# Patient Record
Sex: Male | Born: 1985 | Race: White | Hispanic: No | Marital: Single | Smoking: Never smoker
Health system: Southern US, Community
[De-identification: ages and names within clinical notes are randomized; demographics above are authoritative.]

---
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
## 2006-05-17 ENCOUNTER — Ambulatory Visit: Payer: Self-pay | Admitting: Sports Medicine

## 2006-05-17 DIAGNOSIS — M21619 Bunion of unspecified foot: Secondary | ICD-10-CM

## 2006-05-17 DIAGNOSIS — M2140 Flat foot [pes planus] (acquired), unspecified foot: Secondary | ICD-10-CM | POA: Insufficient documentation

## 2006-05-17 DIAGNOSIS — M25529 Pain in unspecified elbow: Secondary | ICD-10-CM

## 2006-07-24 ENCOUNTER — Ambulatory Visit: Payer: Self-pay | Admitting: Sports Medicine

## 2006-07-24 DIAGNOSIS — M25519 Pain in unspecified shoulder: Secondary | ICD-10-CM

## 2006-08-16 ENCOUNTER — Ambulatory Visit: Payer: Self-pay | Admitting: Sports Medicine

## 2006-09-17 ENCOUNTER — Ambulatory Visit: Payer: Self-pay | Admitting: Family Medicine

## 2006-09-17 DIAGNOSIS — B356 Tinea cruris: Secondary | ICD-10-CM

## 2006-09-17 DIAGNOSIS — M25559 Pain in unspecified hip: Secondary | ICD-10-CM

## 2006-10-09 ENCOUNTER — Ambulatory Visit: Payer: Self-pay | Admitting: Sports Medicine

## 2006-10-29 ENCOUNTER — Telehealth: Payer: Self-pay | Admitting: Sports Medicine

## 2006-12-09 ENCOUNTER — Ambulatory Visit: Payer: Self-pay | Admitting: Sports Medicine

## 2007-01-15 ENCOUNTER — Ambulatory Visit: Payer: Self-pay | Admitting: Sports Medicine

## 2007-01-15 ENCOUNTER — Telehealth: Payer: Self-pay | Admitting: *Deleted

## 2007-01-15 ENCOUNTER — Telehealth: Payer: Self-pay | Admitting: Sports Medicine

## 2007-01-15 DIAGNOSIS — K4090 Unilateral inguinal hernia, without obstruction or gangrene, not specified as recurrent: Secondary | ICD-10-CM | POA: Insufficient documentation

## 2007-01-15 DIAGNOSIS — M76899 Other specified enthesopathies of unspecified lower limb, excluding foot: Secondary | ICD-10-CM | POA: Insufficient documentation

## 2007-02-03 ENCOUNTER — Encounter: Payer: Self-pay | Admitting: Sports Medicine

## 2007-02-25 ENCOUNTER — Ambulatory Visit (HOSPITAL_BASED_OUTPATIENT_CLINIC_OR_DEPARTMENT_OTHER): Admission: RE | Admit: 2007-02-25 | Discharge: 2007-02-25 | Payer: Self-pay | Admitting: Surgery

## 2007-03-17 ENCOUNTER — Encounter: Payer: Self-pay | Admitting: Sports Medicine

## 2007-08-18 ENCOUNTER — Emergency Department (HOSPITAL_COMMUNITY): Admission: EM | Admit: 2007-08-18 | Discharge: 2007-08-18 | Payer: Self-pay | Admitting: Emergency Medicine

## 2007-08-19 ENCOUNTER — Telehealth: Payer: Self-pay | Admitting: *Deleted

## 2007-08-19 ENCOUNTER — Ambulatory Visit: Payer: Self-pay | Admitting: Family Medicine

## 2007-08-19 ENCOUNTER — Encounter (INDEPENDENT_AMBULATORY_CARE_PROVIDER_SITE_OTHER): Payer: Self-pay | Admitting: Family Medicine

## 2007-08-19 DIAGNOSIS — R51 Headache: Secondary | ICD-10-CM

## 2007-08-19 DIAGNOSIS — R519 Headache, unspecified: Secondary | ICD-10-CM | POA: Insufficient documentation

## 2007-08-21 ENCOUNTER — Telehealth (INDEPENDENT_AMBULATORY_CARE_PROVIDER_SITE_OTHER): Payer: Self-pay | Admitting: *Deleted

## 2007-08-24 ENCOUNTER — Emergency Department (HOSPITAL_COMMUNITY): Admission: EM | Admit: 2007-08-24 | Discharge: 2007-08-24 | Payer: Self-pay | Admitting: Emergency Medicine

## 2007-08-25 ENCOUNTER — Encounter: Admission: RE | Admit: 2007-08-25 | Discharge: 2007-08-25 | Payer: Self-pay | Admitting: Emergency Medicine

## 2007-09-11 ENCOUNTER — Ambulatory Visit: Payer: Self-pay | Admitting: Sports Medicine

## 2007-09-11 DIAGNOSIS — L708 Other acne: Secondary | ICD-10-CM | POA: Insufficient documentation

## 2007-10-07 ENCOUNTER — Ambulatory Visit: Payer: Self-pay | Admitting: Sports Medicine

## 2007-10-07 LAB — CONVERTED CEMR LAB
Cholesterol: 154 mg/dL (ref 0–200)
Total CHOL/HDL Ratio: 4.2
Triglycerides: 75 mg/dL (ref <150)
VLDL: 15 mg/dL (ref 0–40)

## 2007-12-16 ENCOUNTER — Telehealth: Payer: Self-pay | Admitting: *Deleted

## 2007-12-18 ENCOUNTER — Encounter: Payer: Self-pay | Admitting: *Deleted

## 2008-01-14 ENCOUNTER — Ambulatory Visit: Payer: Self-pay | Admitting: Family Medicine

## 2008-01-14 ENCOUNTER — Telehealth (INDEPENDENT_AMBULATORY_CARE_PROVIDER_SITE_OTHER): Payer: Self-pay | Admitting: *Deleted

## 2008-01-14 DIAGNOSIS — J1089 Influenza due to other identified influenza virus with other manifestations: Secondary | ICD-10-CM | POA: Insufficient documentation

## 2008-02-11 ENCOUNTER — Telehealth (INDEPENDENT_AMBULATORY_CARE_PROVIDER_SITE_OTHER): Payer: Self-pay | Admitting: *Deleted

## 2008-04-21 ENCOUNTER — Ambulatory Visit: Payer: Self-pay | Admitting: Sports Medicine

## 2008-04-21 DIAGNOSIS — M533 Sacrococcygeal disorders, not elsewhere classified: Secondary | ICD-10-CM | POA: Insufficient documentation

## 2008-04-21 DIAGNOSIS — M545 Low back pain: Secondary | ICD-10-CM

## 2008-06-02 ENCOUNTER — Ambulatory Visit: Payer: Self-pay | Admitting: Sports Medicine

## 2008-06-02 ENCOUNTER — Encounter: Admission: RE | Admit: 2008-06-02 | Discharge: 2008-06-02 | Payer: Self-pay | Admitting: Sports Medicine

## 2008-06-03 ENCOUNTER — Encounter: Admission: RE | Admit: 2008-06-03 | Discharge: 2008-06-03 | Payer: Self-pay | Admitting: Sports Medicine

## 2008-06-14 ENCOUNTER — Encounter: Payer: Self-pay | Admitting: Sports Medicine

## 2008-06-15 ENCOUNTER — Encounter: Payer: Self-pay | Admitting: Sports Medicine

## 2008-06-17 ENCOUNTER — Ambulatory Visit: Payer: Self-pay | Admitting: Sports Medicine

## 2008-06-17 DIAGNOSIS — IMO0002 Reserved for concepts with insufficient information to code with codable children: Secondary | ICD-10-CM

## 2008-07-08 ENCOUNTER — Ambulatory Visit: Payer: Self-pay | Admitting: Sports Medicine

## 2008-07-13 ENCOUNTER — Ambulatory Visit: Payer: Self-pay | Admitting: Sports Medicine

## 2008-07-13 DIAGNOSIS — M940 Chondrocostal junction syndrome [Tietze]: Secondary | ICD-10-CM

## 2008-08-27 ENCOUNTER — Ambulatory Visit: Payer: Self-pay | Admitting: Sports Medicine

## 2008-09-28 ENCOUNTER — Ambulatory Visit: Payer: Self-pay | Admitting: Sports Medicine

## 2008-10-05 ENCOUNTER — Encounter: Payer: Self-pay | Admitting: Sports Medicine

## 2009-02-17 ENCOUNTER — Encounter: Payer: Self-pay | Admitting: Sports Medicine

## 2009-05-28 ENCOUNTER — Emergency Department: Payer: Self-pay | Admitting: Emergency Medicine

## 2009-07-20 ENCOUNTER — Ambulatory Visit: Payer: Self-pay | Admitting: Family Medicine

## 2009-07-20 DIAGNOSIS — S86819A Strain of other muscle(s) and tendon(s) at lower leg level, unspecified leg, initial encounter: Secondary | ICD-10-CM

## 2009-07-20 DIAGNOSIS — S838X9A Sprain of other specified parts of unspecified knee, initial encounter: Secondary | ICD-10-CM | POA: Insufficient documentation

## 2009-09-07 ENCOUNTER — Ambulatory Visit: Payer: Self-pay | Admitting: Sports Medicine

## 2009-09-28 IMAGING — CT CT HEAD W/O CM
1 of 2 series · 13 of 30 positions shown, 17 images · non-contrast
Comparison: No priors

CLINICAL DATA: Headache

CT HEAD WITHOUT CONTRAST
TECHNIQUE: Contiguous axial images were obtained from the base of
the skull through the vertex without contrast.

[Series 2: brain · axial · 0.49mm/px · z∈[+169,+306]mm · 13 of 32 slices shown, 17 images]
[im 3/32  brain]
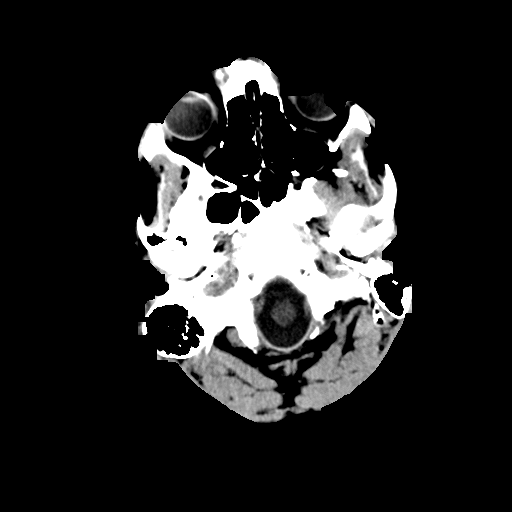
[im 3/32  bone]
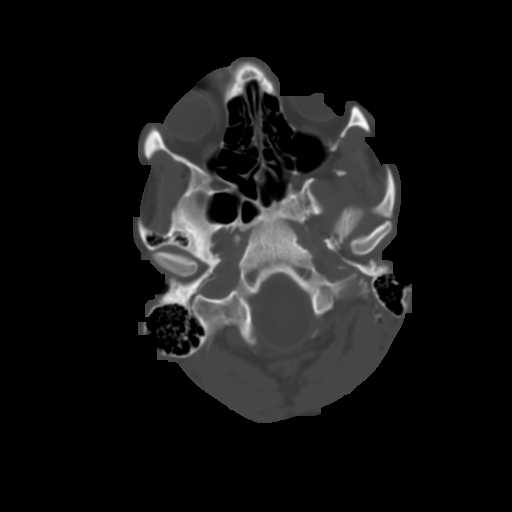
[im 5/32  brain]
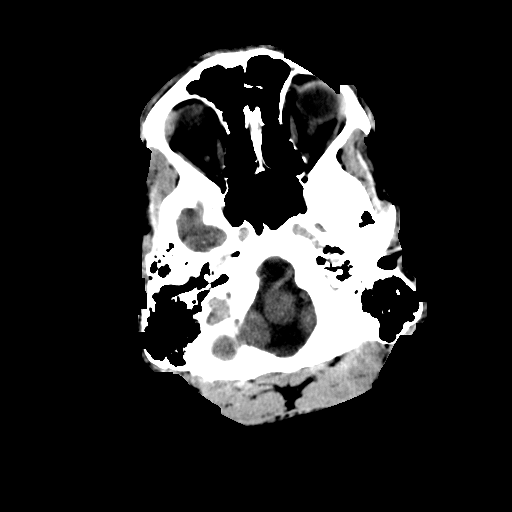
[im 7/32  brain]
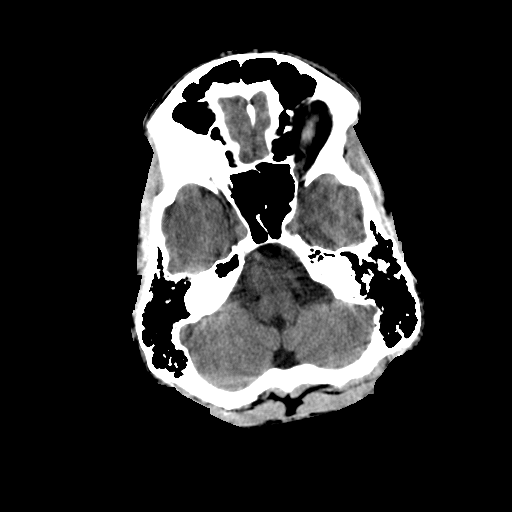
[im 9/32  brain]
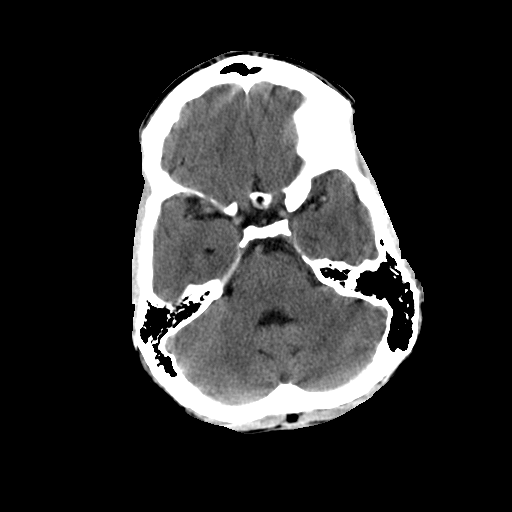
[im 12/32  brain]
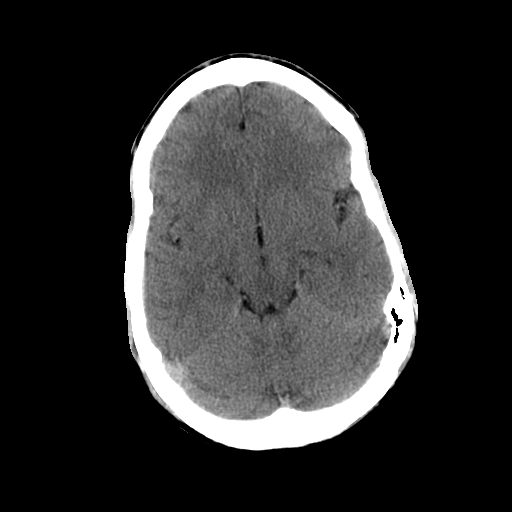
[im 12/32  bone]
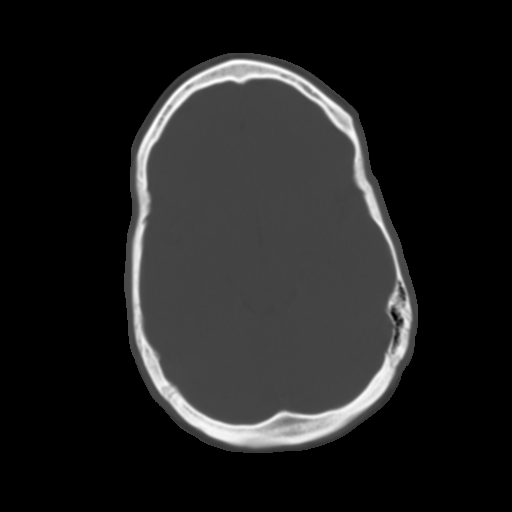
[im 14/32  brain]
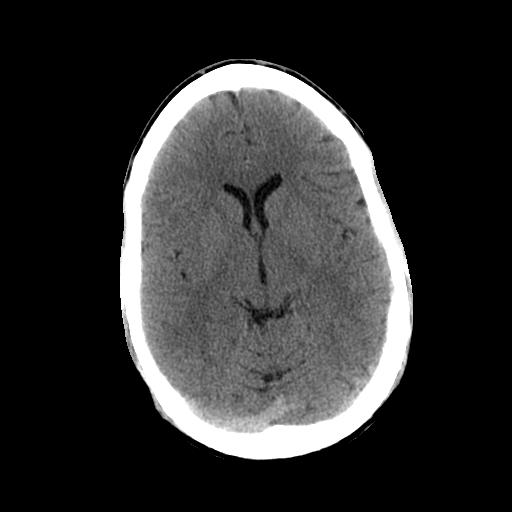
[im 16/32  brain]
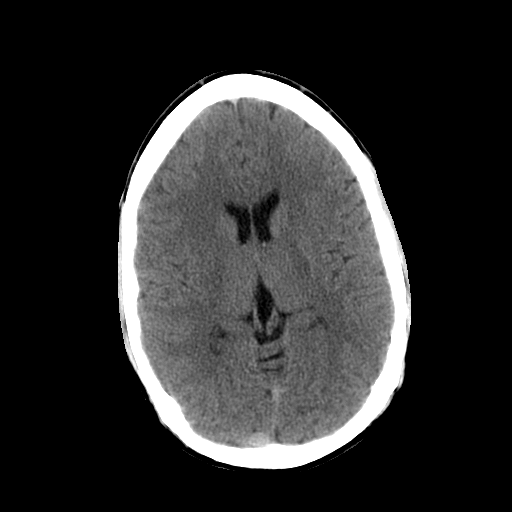
[im 18/32  brain]
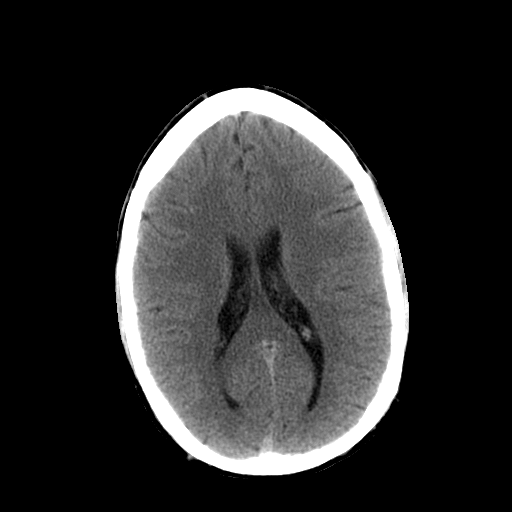
[im 20/32  brain]
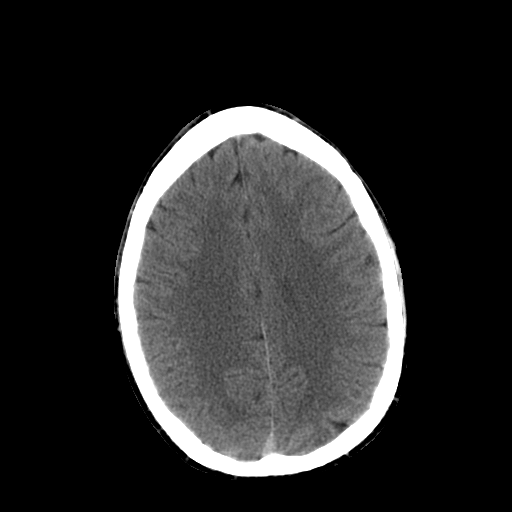
[im 20/32  bone]
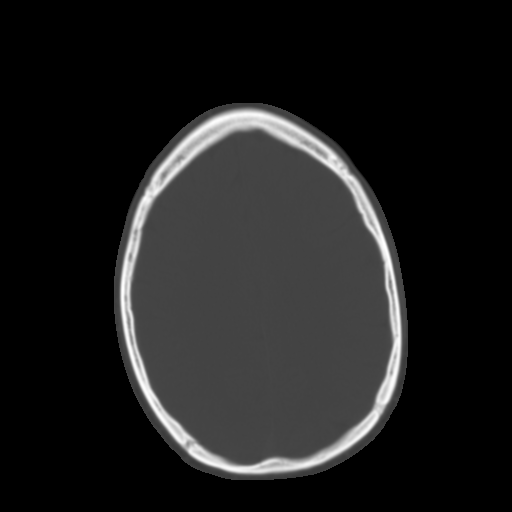
[im 23/32  brain]
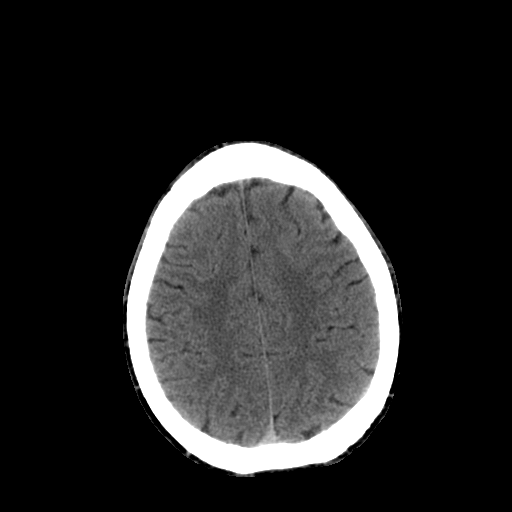
[im 25/32  brain]
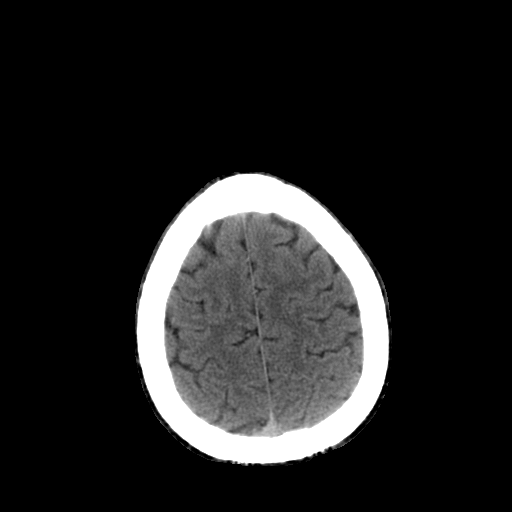
[im 27/32  brain]
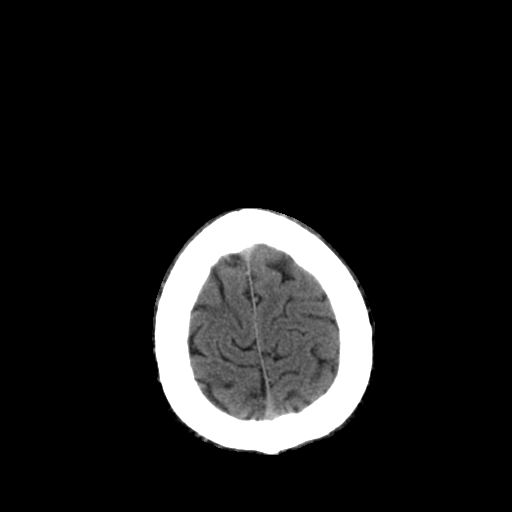
[im 29/32  brain]
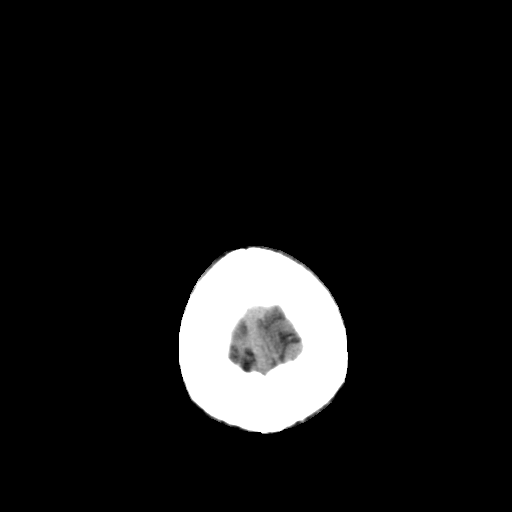
[im 29/32  bone]
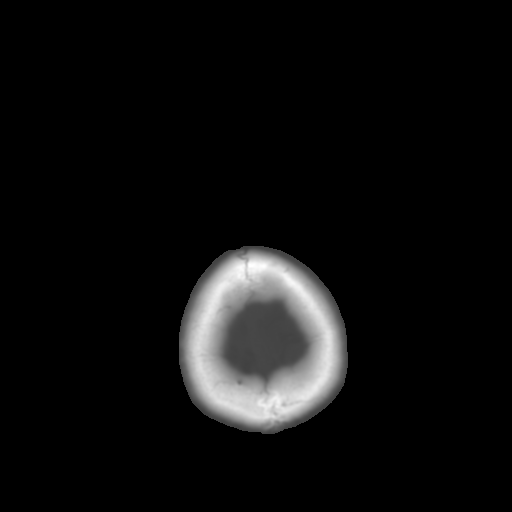

[13 of 30 positions shown; findings below may reference images not displayed]

FINDINGS: [Ventricular size and CSF spaces within normal limits. No
evidence for acute infarct or bleed.  No mass effect. Calvarium
intact.  No fluid in the sinuses visualized.
IMPRESSION: No acute or significant findings.

## 2010-01-11 ENCOUNTER — Encounter (INDEPENDENT_AMBULATORY_CARE_PROVIDER_SITE_OTHER): Payer: Self-pay | Admitting: *Deleted

## 2010-01-11 ENCOUNTER — Ambulatory Visit: Payer: Self-pay | Admitting: Sports Medicine

## 2010-02-06 ENCOUNTER — Ambulatory Visit: Payer: Self-pay | Admitting: Sports Medicine

## 2010-02-06 DIAGNOSIS — R269 Unspecified abnormalities of gait and mobility: Secondary | ICD-10-CM

## 2010-02-22 ENCOUNTER — Emergency Department: Payer: Self-pay | Admitting: Emergency Medicine

## 2010-03-21 NOTE — Miscellaneous (Signed)
  Clinical Lists Changes  Orders: Added new Referral order of Physical Therapy Referral (PT) - Signed      Referral info sent to [facility] in [place] per pt's request [phone].  Rochele Pages RN  January 11, 2010 1:48 PM

## 2010-03-21 NOTE — Assessment & Plan Note (Signed)
Summary: F/U HAMSTRING,MC   Vital Signs:  Patient profile:   25 year old male Height:      75 inches Weight:      190 pounds BMI:     23.83 Pulse rate:   66 / minute BP sitting:   108 / 69  (right arm) Cuff size:   regular  Vitals Entered By: Tessie Fass CMA (September 07, 2009 2:58 PM) CC: F/U left hamstring   Primary Provider:  [facility]  CC:  F/U left hamstring.  History of Present Illness: To office for f/u of L hamstring strain. Has been doing well, able to play golf, basketball, volleyball without any difficulties. Has not done exercises in over 2-weeks b/c was aggrevating slightly while doing. Using hamstring sleeve to keep leg warm with activity. Not taking any medications for it at this time. Denies any pain at this time, no giving out, no tearing sensations. PMH reviewed for relevance, PSH reviewed for relevance  Review of Systems General:  Denies chills, fever, and weakness. MS:  Denies joint pain, joint redness, joint swelling, loss of strength, low back pain, cramps, muscle weakness, and stiffness. Neuro:  Denies numbness, poor balance, tingling, and weakness.  Physical Exam  General:  alert, well-developed, well-nourished, and healthy-appearing.   Msk:  Lower ext: L hamstring without any tenderness.  No swelling or defects noted.  Strength +5/5 in hip & knee b/l.   - Knees: normal ROM, no hamstring pain with knee flexion.  Gait: normal gait without limp.: Neurologic:  sensation intact to light touch.     Impression & Recommendations:  Problem # 1:  MUSCLE STRAIN, HAMSTRING MUSCLE (ICD-844.8) Assessment Improved - symptoms essentially resolved at this time.  Cautioned that he is a risk for re-injury in next 42-months if does not continue exercises/strengthening. - reviewed Hamstring Rehab exercises, should progress to Phase 2 exercises doing 2-3 times/week - should continue to use hamstring sleeve - f/u prn  Complete Medication List: 1)   Voltaren 1 % Gel (Diclofenac sodium) .... Use qid as directed 2)  Transderm Scope  .... One patch q 72h for motion sickness prn 3)  Hydrocodone-acetaminophen 7.5-750 Mg Tabs (Hydrocodone-acetaminophen) 4)  Gabapentin 300 Mg Caps (Gabapentin) .Marland Kitchen.. 1 by mouth three times a day 5)  Voltaren 1 % Gel (Diclofenac sodium) .... Qid to area of pain as needed  Patient Instructions: 1)  - Do hamstring exercises as reviewed 2)  - Use hamstring sleeve with activity 3)  - follow-up as needed

## 2010-03-21 NOTE — Assessment & Plan Note (Signed)
Summary: HAMSTRING INJURY,MC   Primary Provider:  [facility]   History of Present Illness: Injured mid left hamstring while playing 1st base in cold whether. Reached for ball and stretched left hamstring. Heard a pop. Did not finish game. No sports or running activity in the interim. No pain during ambulation. Occasional pain when bending forward and stretching hamstrings. Latest episode a few days ago. Would like to start rehab. No paresthesias. No prior HS injury or procedures. PMH-FH-SH reviewed for relevance  Physical Exam  General:  Well-developed,well-nourished,in no acute distress; alert,appropriate and cooperative throughout examination Msk:  HS: Mild to mod ttp mid med HS on left. No swelling or apparent defect. Normal skin.  HS ULTRASOUND: Intact w/o defects or swelling at area of ttp. Compared to mid left HS.  KNEES: FROM. Full strength except for 4/5 left flexion w/easy fatiguability. No HS pain during left knee flexion.  GAIT: Slightly favoring LLE during ambulation.   Impression & Recommendations:  Problem # 1:  MUSCLE STRAIN, HAMSTRING MUSCLE (ICD-844.8)  - HS rehab protocol. Phase 1 for 2 weeks then transition to phase 2 for 2 wks as tolerated.  Respective handout provided to the patient. - RTC in 4 wks.  Orders: [place] LIMITED (16109)  Complete Medication List: 1)  Voltaren 1 % Gel (Diclofenac sodium) .... Use qid as directed 2)  Transderm Scope  .... One patch q 72h for motion sickness prn 3)  Hydrocodone-acetaminophen 7.5-750 Mg Tabs (Hydrocodone-acetaminophen) 4)  Gabapentin 300 Mg Caps (Gabapentin) .Marland Kitchen.. 1 by mouth three times a day 5)  Voltaren 1 % Gel (Diclofenac sodium) .... Qid to area of pain as needed  Appended Document: Orders Update    Clinical Lists Changes  Orders: Added new Service order of Est. Patient Level IV (60454) - Signed

## 2010-03-21 NOTE — Assessment & Plan Note (Signed)
Summary: CPE,F/U HAMSTRING,MC   Vital Signs:  Patient profile:   25 year old male Height:      75 inches Weight:      203.2 pounds BMI:     25.49 Pulse rate:   72 / minute BP sitting:   112 / 74  (left arm)  Vitals Entered By: Rochele Pages RN (January 11, 2010 11:25 AM) CC: CPE , f/u lt hamstring   Primary Provider:  [facility]  CC:  CPE  and f/u lt hamstring.  History of Present Illness: Pt reports to clinic for CPE and f/u of lt hamstring pain.  Hamstring is improved, but did have an episode of swelling behind left knee after running on treadmill 2 weeks ago.  Did not have pain, iced and took advil. Has not had this type of swelling with riding bike or elliptical.  Doing lower body strengthening exercises x2 per week. Working out regularly has gained 10 lbs of muscle. During past year has had 2 flare ups of back pain, that lasted 1-2 days. Continues on 1 gabapentin at bedtime.     Preventive Screening-Counseling & Management  Alcohol-Tobacco     Smoking Status: never  Allergies (verified): No Known Drug Allergies  Past History:  Past Medical History: lumbar disk 2004 rt ulnar collateral ligament injury 2006 mild flex contractur rt elbow  Past Surgical History: Sports hernia repair 02/2007  Family History: No hx of vascular malformations. 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 was active in basketball and baseball at intramural level at [employer] was in HS sports [employer] (see injuries) no smoking Smoking Status:  never  Physical Exam  General:  Well-developed,well-nourished,in no acute distress; alert,appropriate and cooperative throughout examination Head:  Normocephalic and atraumatic without obvious abnormalities. No  apparent alopecia or balding. Eyes:  No corneal or conjunctival inflammation noted. EOMI. Perrla. Funduscopic exam benign, without hemorrhages, exudates or papilledema. Vision grossly normal. Ears:  External ear exam shows no significant lesions or deformities.  Otoscopic examination reveals clear canals, tympanic membranes are intact bilaterally without bulging, retraction, inflammation or discharge. Hearing is grossly normal bilaterally. Mouth:  Oral mucosa and oropharynx without lesions or exudates.  Teeth in good repair. Neck:  No deformities, masses, or tenderness noted. Lungs:  Normal respiratory effort, chest expands symmetrically. Lungs are clear to auscultation, no crackles or wheezes. Heart:  Normal rate and regular rhythm. S1 and S2 normal without gallop, murmur, click, rub or other extra sounds. Abdomen:  Bowel sounds positive,abdomen soft and non-tender without masses, organomegaly or hernias noted. Msk:  weakness in left HS on eccentric testing  nl  hip, knee and ankle exams  neg SLR bilat   Extremities:  No clubbing, cyanosis, edema, or deformity noted with normal full range of motion of all joints.   Neurologic:  alert & oriented X3 and cranial nerves II-XII intact.     Impression & Recommendations:  Problem # 1:  Preventive Health Care (ICD-V70.0) continue to workout and follow advice about old injuries  Low back - needs to mainatins strength and flex  HS - will refer to PT but needs gain strength try knee sleeve since this is distal  International travel - take Doxycyline if traveling to [employer] in [place] - otherwise would  not use  reck as needed  Complete Medication List: 1)  Voltaren 1 % Gel (Diclofenac sodium) .... Use qid as directed 2)  Transderm Scope  .... One patch q 72h for motion sickness prn 3)  Hydrocodone-acetaminophen 7.5-750 Mg Tabs (Hydrocodone-acetaminophen) 4)  Gabapentin 300 Mg Caps (Gabapentin) .Marland Kitchen.. 1 by mouth three times a day 5)   Voltaren 1 % Gel (Diclofenac sodium) .... Qid to area of pain as needed 6)  Doxycycline Hyclate 100 Mg Tabs (Doxycycline hyclate) .... Take 1 by mouth two times a day 7)  Ciprofloxacin Hcl 500 Mg Tabs (Ciprofloxacin hcl) .... Take 1 by mouth once daily x 5 days  Other Orders: Garment,belt,sleeve or other covering ,elastic or similar stretch (Z6109) Prescriptions: CIPROFLOXACIN HCL 500 MG TABS (CIPROFLOXACIN HCL) Take 1 by mouth once daily x 5 days  #5 x 0   Entered by:   Rochele Pages RN   Authorized by:   Enid Baas MD   Signed by:   Rochele Pages RN on 01/11/2010   Method used:   Electronically to        [facility]  [phone][facility] (retail)       [address] [place], [place]  [address]       Ph: [phone] or [phone]       Fax: [phone]   RxID:   [id number] DOXYCYCLINE HYCLATE 100 MG TABS (DOXYCYCLINE HYCLATE) take 1 by mouth two times a day  #60 x 0   Entered by:   Rochele Pages RN   Authorized by:   Enid Baas MD   Signed by:   Rochele Pages RN on 01/11/2010   Method used:   Electronically to        [facility]  [phone]* (retail)       [address] [place], [place]  [address]       Ph: [phone] or [phone]       Fax: [phone]   RxID:   [id number]    Orders Added: 1)  Garment,belt,sleeve or other covering ,elastic or similar stretch [A4466] 2)  Est. Patient 18-39 years [23762]

## 2010-03-21 NOTE — Consult Note (Signed)
Summary: [facility]  [facility]   Imported By: Marily Memos 03/22/2009 08:57:52  _____________________________________________________________________  External Attachment:    Type:   Image     Comment:   External Document

## 2010-03-23 NOTE — Assessment & Plan Note (Signed)
Summary: ORTHOTICS,MC   Vital Signs:  Patient profile:   25 year old male Pulse rate:   67 / minute BP sitting:   117 / 77  (right arm)  Vitals Entered By: Rochele Pages RN (February 06, 2010 12:46 PM) CC: orthotics   Primary Provider:  [facility]  CC:  orthotics.  History of Present Illness: Patient first placed in orthotics in 2003 with chronic foot pain and early bunion on RT These relieved his pain very well bunion has not progressed in past 8 years  these have also helped limit his chronic LBP first disk herniation was 2004 at age 62  now for sports if he uses orthotics has good control of LBP  last pair was 3 years ago  Preventive Screening-Counseling & Management  Alcohol-Tobacco     Smoking Status: never  Allergies: No Known Drug Allergies  Physical Exam  General:  Well-developed,well-nourished,in no acute distress; alert,appropriate and cooperative throughout examination Msk:  RT foot mild, early bunion change w no change past 8 years pes planus bilat leg length equal  gait is pronated without orthoitcs  much improved and neutral in these   Impression & Recommendations:  Problem # 1:  BUNION, RIGHT FOOT (ICD-727.1)  Patient was fitted for a : standard, cushioned, semi-rigid orthotic. The orthotic was heated and afterward the patient stood on the orthotic blank positioned on the orthotic stand. The patient was positioned in subtalar neutral position and 10 degrees of ankle dorsiflexion in a weight bearing stance. After completion of molding, a stable base was applied to the orthotic blank. The blank was ground to a stable position for weight bearing. 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 Use qid as directed 2)  Transderm Scope  .... One patch q 72h for motion sickness prn 3)  Hydrocodone-acetaminophen 7.5-750 Mg Tabs (Hydrocodone-acetaminophen) 4)  Gabapentin 300 Mg Caps (Gabapentin) .Marland Kitchen.. 1 by mouth three times a day 5)  Voltaren 1 % Gel (Diclofenac sodium) .... Qid to area of pain as needed 6)  Doxycycline Hyclate 100 Mg Tabs (Doxycycline hyclate) .... Take 1 by mouth two times a day 7)  Ciprofloxacin Hcl 500 Mg Tabs (Ciprofloxacin hcl) .... Take 1 by mouth once daily x 5 days   Orders Added: 1)  Est. Patient Level IV [21308] 2)  Orthotic Materials, each unit [L3002] 3)  Orthotic Materials, each unit [L3002]

## 2010-07-04 NOTE — Op Note (Signed)
NAMEAKSHAJ, BESANCON NO.:  [id number]   MEDICAL RECORD NO.:  [id number]          PATIENT TYPE:  AMB   LOCATION:  [facility]                          FACILITY:  [facility]   PHYSICIAN:  Wilmon Arms. Corliss Skains, M.D. DATE OF BIRTH:  [birth year]   DATE OF PROCEDURE:  02/25/2007  DATE OF DISCHARGE:                               OPERATIVE REPORT   PREOPERATIVE DIAGNOSIS:  Right inguinal hernia.   POSTOPERATIVE DIAGNOSIS:  Right inguinal hernia.   PROCEDURE PERFORMED:  Right inguinal hernia repair with mesh.   SURGEON:  Wilmon Arms. Corliss Skains, M.D., FACS   ANESTHESIA:  General.   INDICATIONS:  The patient is a 25 year old male who presents with a  bulge in his right groin for several months.  He has become more  uncomfortable and hurts when he coughs, sneezes or has a bowel movement.  On examination he had a palpable right inguinal hernia with Valsalva  maneuver which spontaneously reduces.  There is no sign of a left  inguinal hernia.   DESCRIPTION OF PROCEDURE:  The patient brought to the operating room,  placed in supine position on the operating table.  After an adequate  level of general anesthesia was obtained, the patient's right groin was  shaved, prepped with Betadine and draped in sterile fashion.  A time-out  was taken to assure the proper patient, proper procedure.  The area  above the right inguinal ligament was infiltrated with 0.25% Marcaine.  An incision was made above the inguinal ligament.  Dissection was  carried down to the external oblique fascia.  The external oblique  fascia seemed to have a fairly large split heading down to the external  ring.  This split was opened with a scalpel and the fascia was opened  down to the external ring.  I bluntly dissected around the spermatic  cord.  The floor of the inguinal canal appeared to be intact.  We then  skeletonized the spermatic cord.  A small indirect hernia sac was  dissected free from the spermatic cord.  We  dissected this free all the  way back to the internal ring.  The sac was then reduced up to the  internal ring.  The internal ring was then tightened with a 2-0 Vicryl  suture.  Ultrapro mesh was cut into keyhole shape and secured beginning  at the pubic tubercle with 2-0 Vicryl sutures.  We used multiple  interrupted Vicryl sutures to secure the mesh to the internal oblique  fascia superiorly and the shelving edge inferiorly.  The tails were  sutured together around the spermatic cord and tucked underneath the  external oblique  fascia.  The fascia was reapproximated with 2-0 Vicryl.  3-0 Vicryl was  used to close subcutaneous tissues and 4-0 Monocryl was used to close  the skin.  Steri-Strips and clean dressings were applied.  The patient  was extubated, brought to recovery in stable condition.  All sponge,  instrument, needle counts were correct.      Wilmon Arms. Tsuei, M.D.  Electronically Signed     MKT/MEDQ  D:  02/25/2007  T:  02/25/2007  Job:  [id number]   cc:   Sibyl Parr. Darrick Penna, M.D.

## 2010-11-08 LAB — BASIC METABOLIC PANEL
CO2: 29
Chloride: 106
Creatinine, Ser: 1.27
GFR calc Af Amer: >60
Glucose, Bld: 105 — ABNORMAL HIGH

## 2010-11-08 LAB — DIFFERENTIAL
Basophils Absolute: 0
Basophils Relative: 0
Eosinophils Absolute: 0.1
Monocytes Absolute: 0.3
Monocytes Relative: 8
Neutrophils Relative %: 57

## 2010-11-08 LAB — CBC
Hemoglobin: 14.5
MCHC: 35
MCV: 90.6
RBC: 4.57
RDW: 11.9

## 2010-11-16 LAB — CSF CULTURE W GRAM STAIN: Gram Stain: NONE SEEN

## 2010-11-16 LAB — BASIC METABOLIC PANEL
BUN: 16
Calcium: 10.2
Creatinine, Ser: 1.14
GFR calc non Af Amer: >60
Glucose, Bld: 107 — ABNORMAL HIGH

## 2010-11-16 LAB — CSF CELL COUNT WITH DIFFERENTIAL: Tube #: 4

## 2010-11-16 LAB — CBC
Platelets: 167
RDW: 12.3

## 2010-11-16 LAB — DIFFERENTIAL
Basophils Absolute: 0
Eosinophils Relative: 0
Lymphocytes Relative: 18
Neutro Abs: 5.6
Neutrophils Relative %: 73

## 2012-08-19 ENCOUNTER — Ambulatory Visit (INDEPENDENT_AMBULATORY_CARE_PROVIDER_SITE_OTHER): Payer: 59 | Admitting: Sports Medicine

## 2012-08-19 VITALS — BP 110/60 | Ht 76.0 in | Wt 204.0 lb

## 2012-08-19 DIAGNOSIS — M25569 Pain in unspecified knee: Secondary | ICD-10-CM

## 2012-08-19 DIAGNOSIS — R269 Unspecified abnormalities of gait and mobility: Secondary | ICD-10-CM

## 2012-08-19 DIAGNOSIS — M25562 Pain in left knee: Secondary | ICD-10-CM | POA: Insufficient documentation

## 2012-08-19 NOTE — Assessment & Plan Note (Signed)
This seems to have a dynamic genu valgus component  See the plan as noted  Reck in 2 mos

## 2012-08-19 NOTE — Assessment & Plan Note (Signed)
Patient was fitted for a : standard, cushioned, semi-rigid orthotic. The orthotic was heated and afterward the patient stood on the orthotic blank positioned on the orthotic stand. The patient was positioned in subtalar neutral position and 10 degrees of ankle dorsiflexion in a weight bearing stance. After completion of molding, a stable base was applied to the orthotic blank. The blank was ground to a stable position for weight bearing. Size: 14 red EVA Base: blue EVA Posting: none Additional orthotic padding: none Note time required 45 mins  Gait was neutral and comfortable after this  Will need to stay in these to block pronated gait

## 2012-08-19 NOTE — Progress Notes (Signed)
   [facility] [address] [place], [place] [address] Phone: [phone] Fax: [phone]   Patient Name: Peter Gilbert Date of Birth: 1985-09-28 Medical Record Number: [id number] Gender: male Date of Encounter: 08/19/2012  History of Present Illness:  Peter Gilbert is a 27 y.o. very pleasant male patient who presents with the following:  26 y/o CM presenting to clinic with Lt knee pain. Onset about 2 months prior. States that most pain is located at medial aspect of Lt knee. No recent injuries. Has not changed intensity of workouts recently. Denies stiffness, instability, popping, locking, catching, effusion. Currently taking ibuprofen 400 mg prn for pain and inflammation. Does have 3 sets of custom orthotics that were made for patient in past for pes planus and Rt great toe bunion. Denies fevers, chills, SOB, abd pain, nausea, vomiting, diarrhea, arthralgias, myalgias, rash.   Past Medical, Surgical, Social, and Family History Reviewed. Medications and Allergies reviewed and all updated if necessary.  Review of Systems: See HPI above.   Physical Examination: Filed Vitals:   08/19/12 1123  BP: 110/60   Filed Vitals:   08/19/12 1123  Height: 6\' 4"  (1.93 m)  Weight: 204 lb (92.534 kg)   Body mass index is 24.84 kg/(m^2).  Gen: NAD. Pt is pleasant and cooperative today. Skin: No rashes, deformities, or suspicious lesion  Lt knee: No TTP in medial/lateral/anterior/posterior joint lines or pes anserine. No effusion on exam. ROM 0-125. Patella grind and apprehension neg. Valgus/Varus stress neg. Anterior drawer neg. Posterior drawer neg. Posterior sag neg. Lachman neg. McMurray neg. Thessaly neg.  Lt hip: No TTP of ASIS, iliac crest, ischial spine, greater trochanter. ROM flexion 0-125, IR 45, ER 45.  Strength Lt gluteus medius 4/5, maximus 5/5, tensor fascia lata 5/5.  FADER neg. FADIR neg. Straight leg neg. Step up with valgus collapse of  Lt knee.  Stance: Pes planus noted.  Assessment and Plan: Abnormality of gait  Left medial knee pain  1. Lt medial knee pain - most likely secondary to valgus collapse of Lt knee as seen during stepup. Also noted weakness in Lt hip abductors. Pt given exercises to increase ROM and strength in hip abductors today. Had pt demonstrate exercises in clinic today. Was given instructions on amount and frequency of exercises. Pt to RTC if sx worsen or change.  2. Pes planus - pt provided with new orthotics today for arch support.   Enid Baas, MD

## 2012-09-18 ENCOUNTER — Encounter: Payer: Self-pay | Admitting: Sports Medicine

## 2014-06-14 ENCOUNTER — Ambulatory Visit (INDEPENDENT_AMBULATORY_CARE_PROVIDER_SITE_OTHER): Payer: No Typology Code available for payment source | Admitting: Sports Medicine

## 2014-06-14 ENCOUNTER — Encounter: Payer: Self-pay | Admitting: Sports Medicine

## 2014-06-14 VITALS — BP 107/65 | Ht 76.0 in | Wt 200.0 lb

## 2014-06-14 DIAGNOSIS — S83411A Sprain of medial collateral ligament of right knee, initial encounter: Secondary | ICD-10-CM | POA: Diagnosis not present

## 2014-06-14 DIAGNOSIS — M25561 Pain in right knee: Secondary | ICD-10-CM | POA: Diagnosis not present

## 2014-06-14 NOTE — Progress Notes (Signed)
   Subjective:    Patient ID: Peter Gilbert, male    DOB: Dec 31, 1985, 29 y.o.   MRN: [id number]  HPI  chief complaint: Right knee pain  Very pleasant 29 year old male comes in today complaining of right knee pain. He injured his knee while playing basketball in mid February. While turning to make a pass, he felt a pop in his knee. This was followed by some swelling along the medial knee. 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 He was also evaluated by one of the sports medicine fellows. He was diagnosed with a grade 2 MCL sprain. He was placed into an immobilizer for 4 weeks and then transitioned into a range of motion brace. He continues to have intermittent medial sided knee pain with activity. Also some mild instability. No further swelling. No significant injuries to this knee in the past. No prior knee surgeries. [facility]. Past medical history reviewed Medications reviewed Allergies reviewed    Review of Systems as above    Objective:   Physical Exam   well-developed, well-nourished. No acute distress.   right knee: Full range of motion. No effusion. Negative patellar apprehension. Negative Lachman's, negative anterior drawer. Negative posterior drawer. Slight tenderness to palpation along the medial joint line but a negative McMurray's. Negative Thessaly's. Some tenderness at the distal hamstring insertion as well. Knee is stable to valgus and varus stressing. I do not appreciate any significant MCL laxity. There is no tenderness to palpation along the course of the MCL. Neurovascularly intact distally. Walking without a limp.      Assessment & Plan:   right knee pain secondary to healing MCL sprain  Patient's injury was a little over 2 months ago. I discussed the possibility of concomitant meniscal or possibly anterior cruciate ligament injury along with his  MCL sprain. We discussed an MRI to evaluate further but we've decided to wait another month or so. I would expect that his symptoms would resolve over this time. I've given him a body helix sleeve to wear with activity and he will follow-up with me in 4 weeks. If symptoms persist we will reconsider MRI. If, on the other hand, he is feeling better he may feel free to cancel his follow-up appointment and follow-up as needed. I had a thorough discussion with him regarding his rehabilitation exercises and it sounds like he is [facility].

## 2014-07-12 ENCOUNTER — Ambulatory Visit: Payer: No Typology Code available for payment source | Admitting: Sports Medicine
